# Patient Record
Sex: Male | Born: 1990 | Race: Black or African American | Hispanic: No | Marital: Single | Smoking: Never smoker
Health system: Southern US, Community
[De-identification: ages and names within clinical notes are randomized; demographics above are authoritative.]

## PROBLEM LIST (undated history)

## (undated) DIAGNOSIS — K2970 Gastritis, unspecified, without bleeding: Secondary | ICD-10-CM

## (undated) DIAGNOSIS — K529 Noninfective gastroenteritis and colitis, unspecified: Secondary | ICD-10-CM

---

## 2019-06-17 ENCOUNTER — Emergency Department (HOSPITAL_COMMUNITY)
Admission: EM | Admit: 2019-06-17 | Discharge: 2019-06-17 | Disposition: A | Discharge status: Home / Self Care | Payer: No Typology Code available for payment source | Attending: Emergency Medicine | Admitting: Emergency Medicine

## 2019-06-17 ENCOUNTER — Emergency Department (HOSPITAL_COMMUNITY): Payer: No Typology Code available for payment source

## 2019-06-17 ENCOUNTER — Other Ambulatory Visit: Payer: Self-pay

## 2019-06-17 ENCOUNTER — Encounter (HOSPITAL_COMMUNITY): Payer: Self-pay | Admitting: *Deleted

## 2019-06-17 DIAGNOSIS — Z20822 Contact with and (suspected) exposure to covid-19: Secondary | ICD-10-CM | POA: Insufficient documentation

## 2019-06-17 DIAGNOSIS — F12188 Cannabis abuse with other cannabis-induced disorder: Secondary | ICD-10-CM | POA: Insufficient documentation

## 2019-06-17 DIAGNOSIS — R1011 Right upper quadrant pain: Secondary | ICD-10-CM | POA: Diagnosis present

## 2019-06-17 HISTORY — DX: Noninfective gastroenteritis and colitis, unspecified: K52.9

## 2019-06-17 HISTORY — DX: Gastritis, unspecified, without bleeding: K29.70

## 2019-06-17 LAB — URINALYSIS, ROUTINE W REFLEX MICROSCOPIC
Bilirubin Urine: NEGATIVE
Glucose, UA: NEGATIVE mg/dL
Hgb urine dipstick: NEGATIVE
Ketones, ur: NEGATIVE mg/dL
Leukocytes,Ua: NEGATIVE
Nitrite: NEGATIVE
Protein, ur: NEGATIVE mg/dL
Specific Gravity, Urine: 1.013 (ref 1.005–1.030)
pH: 9 — ABNORMAL HIGH (ref 5.0–8.0)

## 2019-06-17 LAB — COMPREHENSIVE METABOLIC PANEL
ALT: 39 U/L (ref 0–44)
AST: 39 U/L (ref 15–41)
Albumin: 4 g/dL (ref 3.5–5.0)
Alkaline Phosphatase: 75 U/L (ref 38–126)
Anion gap: 10 (ref 5–15)
BUN: 12 mg/dL (ref 6–20)
CO2: 23 mmol/L (ref 22–32)
Calcium: 9 mg/dL (ref 8.9–10.3)
Chloride: 107 mmol/L (ref 98–111)
Creatinine, Ser: 1.1 mg/dL (ref 0.61–1.24)
GFR calc Af Amer: >60 mL/min (ref >60)
GFR calc non Af Amer: >60 mL/min (ref >60)
Glucose, Bld: 111 mg/dL — ABNORMAL HIGH (ref 70–99)
Potassium: 3.5 mmol/L (ref 3.5–5.1)
Sodium: 140 mmol/L (ref 135–145)
Total Bilirubin: 0.4 mg/dL (ref 0.3–1.2)
Total Protein: 7.1 g/dL (ref 6.5–8.1)

## 2019-06-17 LAB — CBC
HCT: 43.3 % (ref 39.0–52.0)
Hemoglobin: 14.6 g/dL (ref 13.0–17.0)
MCH: 33.7 pg (ref 26.0–34.0)
MCHC: 33.7 g/dL (ref 30.0–36.0)
MCV: 100 fL (ref 80.0–100.0)
Platelets: 116 10*3/uL — ABNORMAL LOW (ref 150–400)
RBC: 4.33 MIL/uL (ref 4.22–5.81)
RDW: 12.4 % (ref 11.5–15.5)
WBC: 7.8 10*3/uL (ref 4.0–10.5)
nRBC: 0 % (ref 0.0–0.2)

## 2019-06-17 LAB — POC SARS CORONAVIRUS 2 AG -  ED: SARS Coronavirus 2 Ag: NEGATIVE

## 2019-06-17 LAB — LIPASE, BLOOD: Lipase: 26 U/L (ref 11–51)

## 2019-06-17 MED ORDER — SODIUM CHLORIDE (PF) 0.9 % IJ SOLN
INTRAMUSCULAR | Status: AC
  Filled 2019-06-17: qty 50

## 2019-06-17 MED ORDER — ONDANSETRON HCL 4 MG/2ML IJ SOLN
4.0000 mg | Freq: Once | INTRAMUSCULAR | Status: AC
  Administered 2019-06-17: 4 mg via INTRAVENOUS
  Filled 2019-06-17: qty 2

## 2019-06-17 MED ORDER — MORPHINE SULFATE (PF) 4 MG/ML IV SOLN
4.0000 mg | Freq: Once | INTRAVENOUS | Status: AC
  Administered 2019-06-17: 4 mg via INTRAVENOUS
  Filled 2019-06-17: qty 1

## 2019-06-17 MED ORDER — SODIUM CHLORIDE 0.9 % IV SOLN: INTRAVENOUS | Status: DC

## 2019-06-17 MED ORDER — HALOPERIDOL LACTATE 5 MG/ML IJ SOLN
2.0000 mg | Freq: Once | INTRAMUSCULAR | Status: AC
  Administered 2019-06-17: 2 mg via INTRAVENOUS
  Filled 2019-06-17: qty 1

## 2019-06-17 MED ORDER — SODIUM CHLORIDE 0.9% FLUSH: 3.0000 mL | Freq: Once | INTRAVENOUS | Status: DC

## 2019-06-17 MED ORDER — SODIUM CHLORIDE 0.9 % IV BOLUS
2000.0000 mL | Freq: Once | INTRAVENOUS | Status: AC
  Administered 2019-06-17: 2000 mL via INTRAVENOUS

## 2019-06-17 MED ORDER — METOCLOPRAMIDE HCL 5 MG/ML IJ SOLN: 10.0000 mg | Freq: Once | INTRAMUSCULAR | Status: DC

## 2019-06-17 MED ORDER — IOHEXOL 300 MG/ML  SOLN
100.0000 mL | Freq: Once | INTRAMUSCULAR | Status: AC | PRN
  Administered 2019-06-17: 100 mL via INTRAVENOUS

## 2019-06-17 NOTE — ED Notes (Signed)
Pt transported to CT ?

## 2019-06-17 NOTE — ED Triage Notes (Signed)
Pt come in with pain, chills, shakes, also with N/V. Started this morning

## 2019-06-17 NOTE — ED Provider Notes (Signed)
[facility] DEPT Provider Note   CSN: [id number] Arrival date & time: 06/17/19  1133     History Chief Complaint  Patient presents with  . Abdominal Pain  . Nausea  . Emesis    Zachary Sanders is a 29 y.o. male.  29 year old male with history of colitis presents with 24 hours of chills shakes, nausea and vomiting.  Patient states that he smokes copious amounts of cannabis every day.  Denies any other illicit drug use.  No fever appreciated.  Emesis has been nonbilious.  Pain is in his epigastric and right upper quadrant and characterizes dull and persistent.  Denies any urinary complaints of hematuria or dysuria.  No treatment use prior to arrival        Past Medical History:  Diagnosis Date  . Colitis   . Gastritis     There are no problems to display for this patient.   History reviewed. No pertinent surgical history.     No family history on file.  Social History   Tobacco Use  . Smoking status: Never Smoker  . Smokeless tobacco: Never Used  Substance Use Topics  . Alcohol use: Yes  . Drug use: Yes    Types: Marijuana    Comment: yesterday 06/16/19    Home Medications Prior to Admission medications   Not on File    Allergies    Patient has no known allergies.  Review of Systems   Review of Systems  All other systems reviewed and are negative.   Physical Exam Updated Vital Signs BP 138/82 (BP Location: Left Arm)   Pulse 90   Temp 97.7 F (36.5 C) (Oral)   Resp 18   Ht 1.829 m (6')   Wt 68 kg   SpO2 100%   BMI 20.34 kg/m   Physical Exam Vitals and nursing note reviewed.  Constitutional:      General: He is not in acute distress.    Appearance: Normal appearance. He is well-developed. He is not toxic-appearing.  HENT:     Head: Normocephalic and atraumatic.  Eyes:     General: Lids are normal.     Conjunctiva/sclera: Conjunctivae normal.     Pupils: Pupils are equal, round, and reactive to light.    Neck:     Thyroid: No thyroid mass.     Trachea: No tracheal deviation.  Cardiovascular:     Rate and Rhythm: Normal rate and regular rhythm.     Heart sounds: Normal heart sounds. No murmur. No gallop.   Pulmonary:     Effort: Pulmonary effort is normal. No respiratory distress.     Breath sounds: Normal breath sounds. No stridor. No decreased breath sounds, wheezing, rhonchi or rales.  Abdominal:     General: Bowel sounds are normal. There is no distension.     Palpations: Abdomen is soft.     Tenderness: There is abdominal tenderness in the right upper quadrant and epigastric area. There is no guarding or rebound.    Musculoskeletal:        General: No tenderness. Normal range of motion.     Cervical back: Normal range of motion and neck supple.  Skin:    General: Skin is warm and dry.     Findings: No abrasion or rash.  Neurological:     Mental Status: He is alert and oriented to person, place, and time.     GCS: GCS eye subscore is 4. GCS verbal subscore is 5. GCS  motor subscore is 6.     Cranial Nerves: No cranial nerve deficit.     Sensory: No sensory deficit.  Psychiatric:        Speech: Speech normal.        Behavior: Behavior normal.     ED Results / Procedures / Treatments   Labs (all labs ordered are listed, but only abnormal results are displayed) Labs Reviewed  LIPASE, BLOOD  COMPREHENSIVE METABOLIC PANEL  CBC  URINALYSIS, ROUTINE W REFLEX MICROSCOPIC  POC SARS CORONAVIRUS 2 AG -  ED    EKG None  Radiology No results found.  Procedures Procedures (including critical care time)  Medications Ordered in ED Medications  morphine 4 MG/ML injection 4 mg (has no administration in time range)  sodium chloride 0.9 % bolus 2,000 mL (has no administration in time range)  0.9 %  sodium chloride infusion (has no administration in time range)  ondansetron (ZOFRAN) injection 4 mg (has no administration in time range)  haloperidol lactate (HALDOL) injection 2  mg (has no administration in time range)    ED Course  I have reviewed the triage vital signs and the nursing notes.  Pertinent labs & imaging results that were available during my care of the patient were reviewed by me and considered in my medical decision making (see chart for details).    MDM Rules/Calculators/A&P                      Patient admits to copious use of marijuana.  Work-up here including CT was negative.  Was given antiemetics as well as analgesic medication and feels much better at this time.  States he feels back to his baseline and will discharge home Final Clinical Impression(s) / ED Diagnoses Final diagnoses:  None    Rx / DC Orders ED Discharge Orders    None       Lorre Nick, MD 06/17/19 1444

## 2020-10-15 IMAGING — CT CT ABD-PELV W/ CM
2 of 3 series · 15 of 42 positions shown, 17 images · IV contrast (omnipaque)
Comparison: None.

CLINICAL DATA: Abdominal pain and distention. Nausea and vomiting.

EXAM:
CT ABDOMEN AND PELVIS WITH CONTRAST
TECHNIQUE: Multidetector CT imaging of the abdomen and pelvis was performed
using the standard protocol following bolus administration of
intravenous contrast.
CONTRAST:  100mL OMNIPAQUE IOHEXOL 300 MG/ML  SOLN

[Series 2: axial st · axial · 0.79mm/px · z∈[+967,+1472]mm · 12 of 113 slices shown, 14 images]
[im 6/113  soft-tissue]
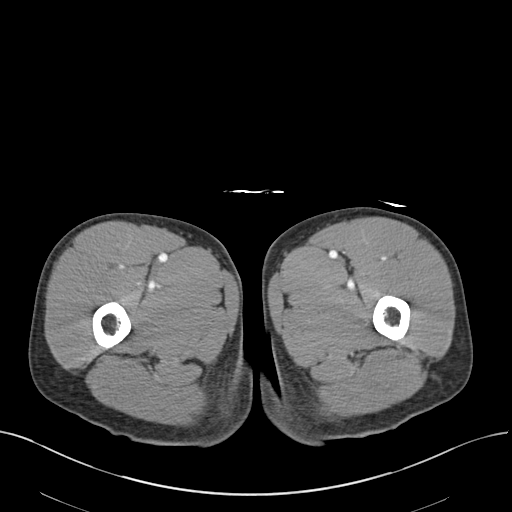
[im 6/113  bone]
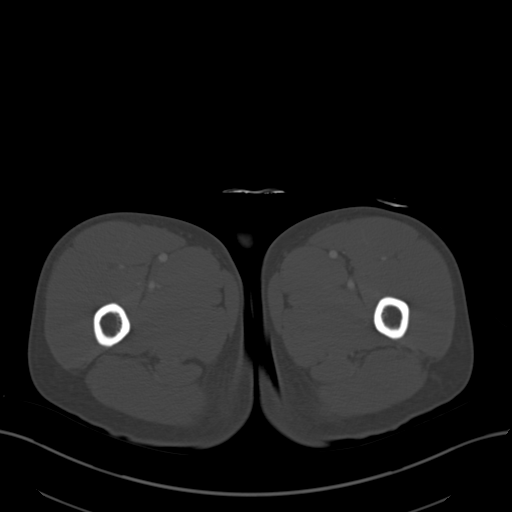
[im 18/113  soft-tissue]
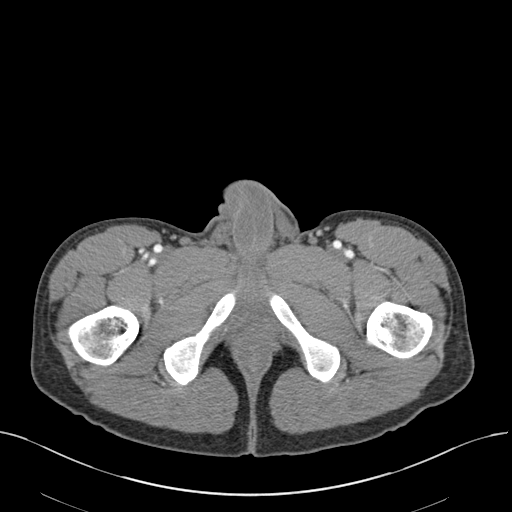
[im 24/113  soft-tissue]
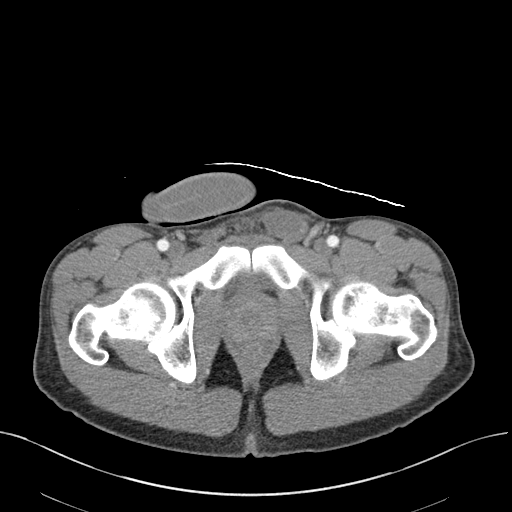
[im 36/113  soft-tissue]
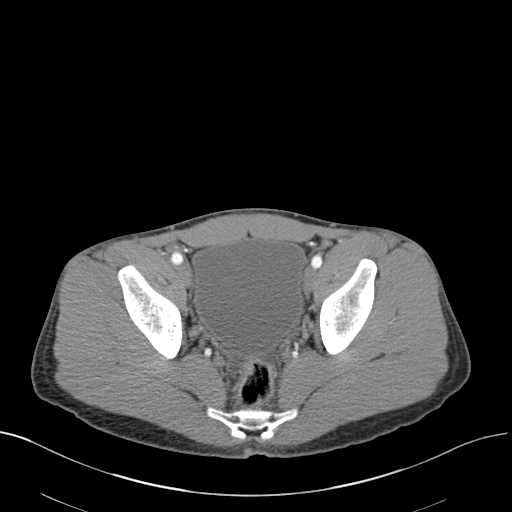
[im 42/113  soft-tissue]
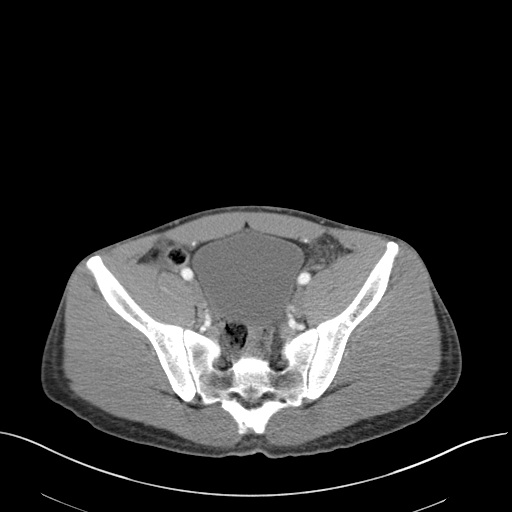
[im 54/113  soft-tissue]
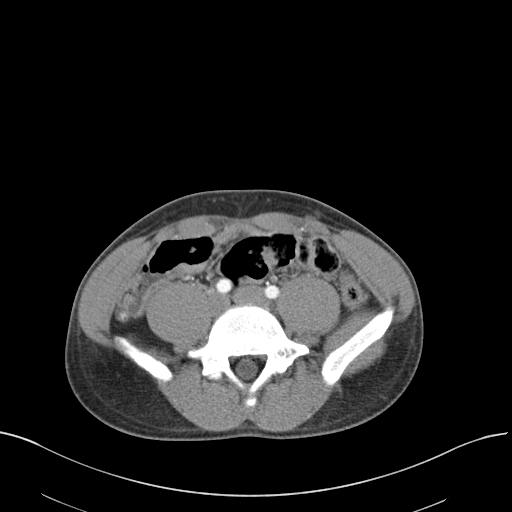
[im 59/113  soft-tissue]
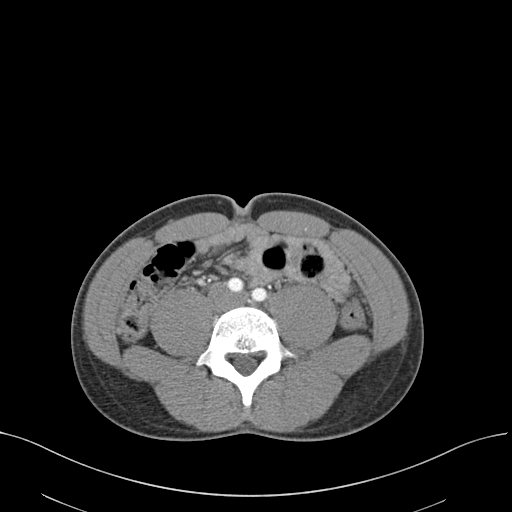
[im 71/113  soft-tissue]
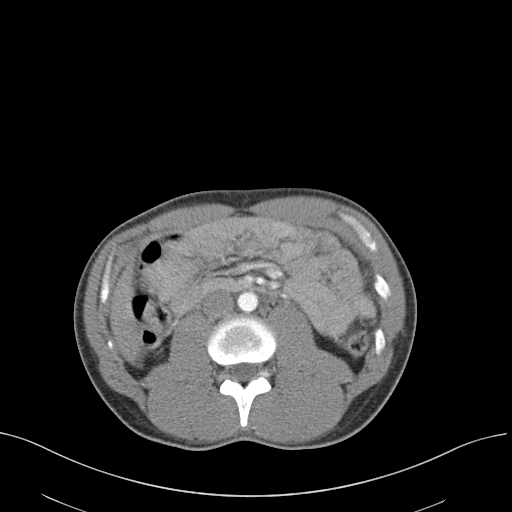
[im 77/113  soft-tissue]
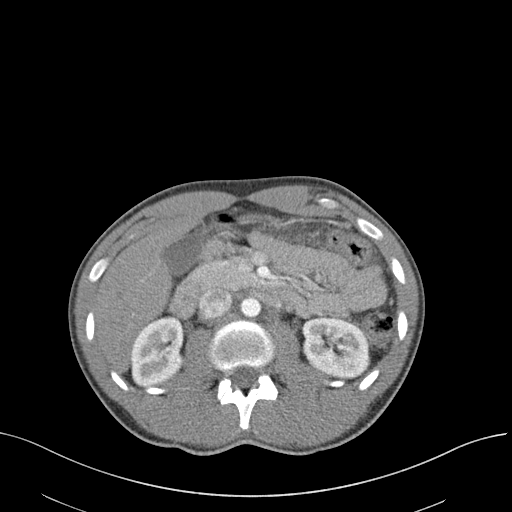
[im 77/113  bone]
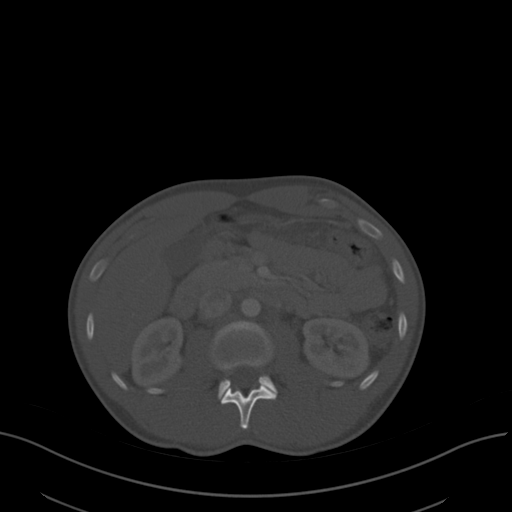
[im 89/113  soft-tissue]
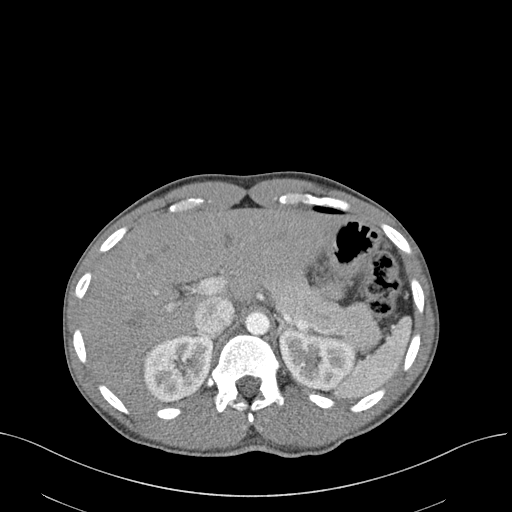
[im 95/113  soft-tissue]
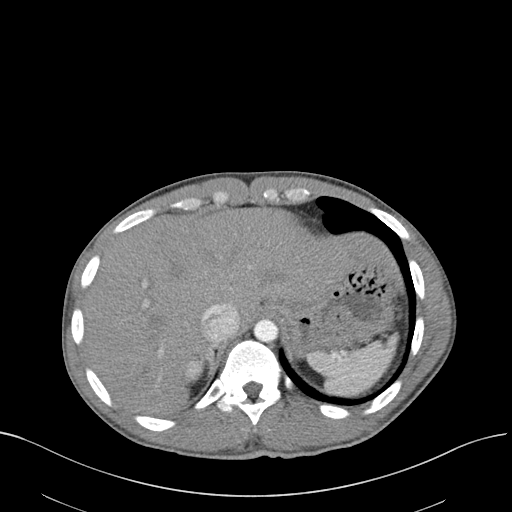
[im 107/113  soft-tissue]
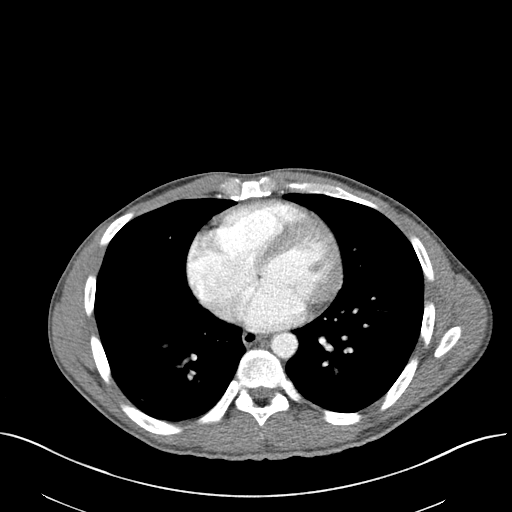

[Series 4: coronal st · coronal · 0.64mm/px · 3 of 120 slices shown]
[im 40/120  soft-tissue]
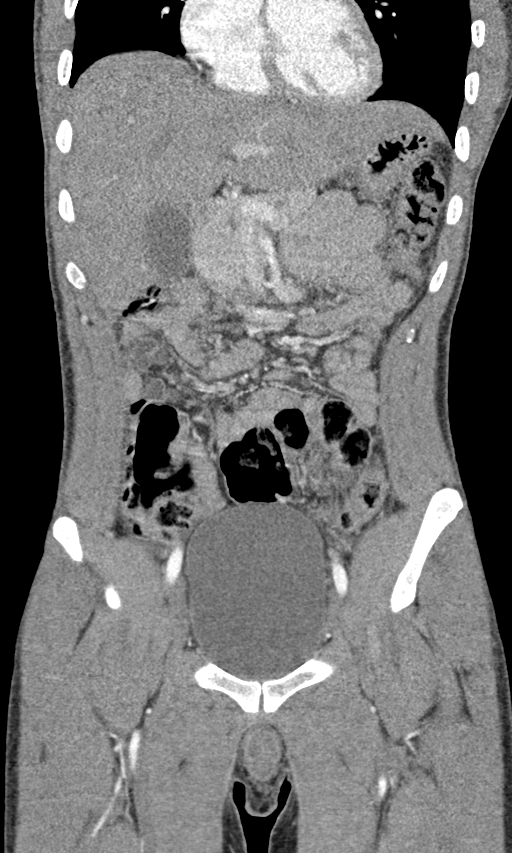
[im 53/120  soft-tissue]
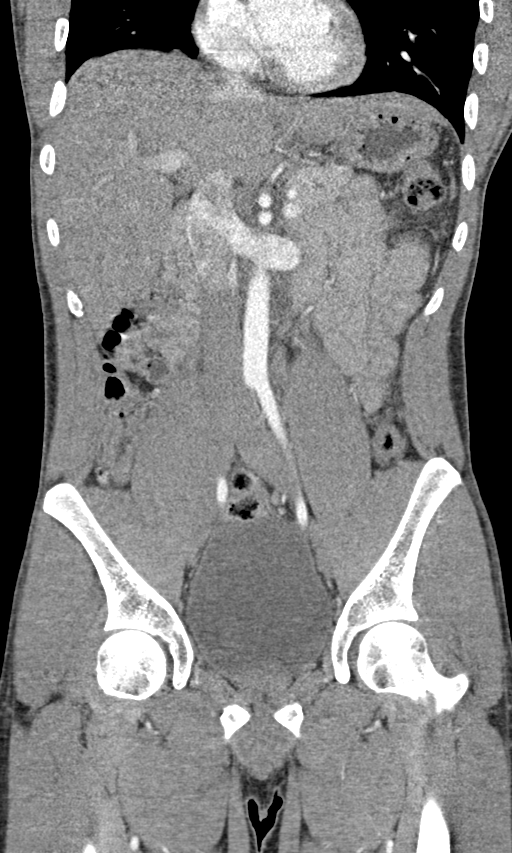
[im 67/120  soft-tissue]
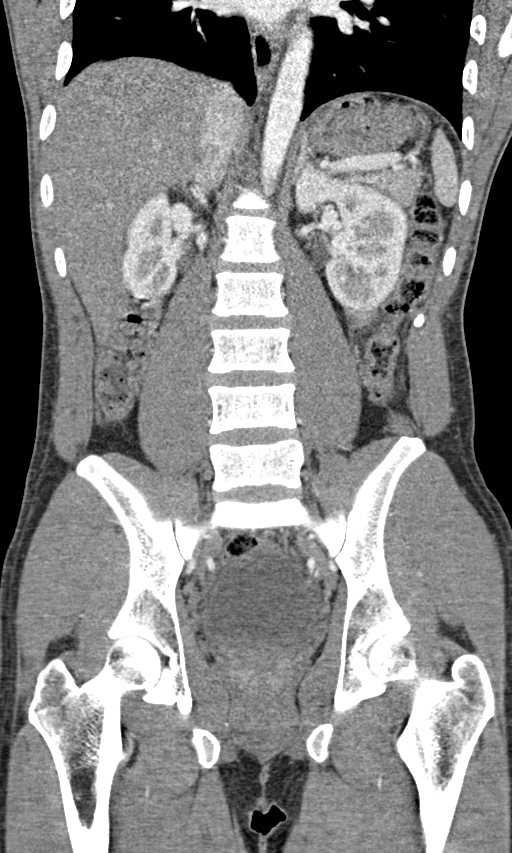

[15 of 42 positions shown; findings below may reference images not displayed]

FINDINGS: Lower chest: Normal.

Hepatobiliary: No focal liver abnormality is seen. No gallstones,
gallbladder wall thickening, or biliary dilatation.

Pancreas: Unremarkable. No pancreatic ductal dilatation or
surrounding inflammatory changes.

Spleen: Normal in size without focal abnormality.

Adrenals/Urinary Tract: Adrenal glands are unremarkable. Kidneys are
normal, without renal calculi, focal lesion, or hydronephrosis.
Bladder is unremarkable.

Stomach/Bowel: Stomach is within normal limits. Appendix appears
normal. No evidence of bowel wall thickening, distention, or
inflammatory changes.

Vascular/Lymphatic: No significant vascular findings are present. No
enlarged abdominal or pelvic lymph nodes.

Reproductive: The left testicle lies in the left inguinal canal just
below the inguinal ligament. Prostate is unremarkable.

Other: Tiny amount of free fluid in the pelvis posterior to the
bladder. Otherwise normal. No abdominal wall hernias.

Musculoskeletal: No acute or significant osseous findings.
IMPRESSION: 1. Tiny amount of free fluid in the pelvis, nonspecific. Otherwise
benign appearing abdomen and pelvis.
2. The left testicle lies in the left inguinal canal just below the
inguinal ligament.
